# Patient Record
Sex: Female | Born: 1975 | Race: White | Hispanic: No | Marital: Married | Smoking: Never smoker
Health system: Southern US, Community
[De-identification: ages and names within clinical notes are randomized; demographics above are authoritative.]

## PROBLEM LIST (undated history)

## (undated) DIAGNOSIS — Z789 Other specified health status: Secondary | ICD-10-CM

## (undated) HISTORY — PX: CHOLECYSTECTOMY: SHX55

## (undated) HISTORY — DX: Other specified health status: Z78.9

## (undated) HISTORY — PX: HIATAL HERNIA REPAIR: SHX195

---

## 2008-04-20 ENCOUNTER — Ambulatory Visit (HOSPITAL_COMMUNITY): Admission: RE | Admit: 2008-04-20 | Discharge: 2008-04-20 | Payer: Self-pay | Admitting: Surgery

## 2008-04-27 ENCOUNTER — Ambulatory Visit (HOSPITAL_COMMUNITY): Admission: RE | Admit: 2008-04-27 | Discharge: 2008-04-27 | Payer: Self-pay | Admitting: Surgery

## 2008-05-03 ENCOUNTER — Ambulatory Visit (HOSPITAL_BASED_OUTPATIENT_CLINIC_OR_DEPARTMENT_OTHER): Admission: RE | Admit: 2008-05-03 | Discharge: 2008-05-03 | Payer: Self-pay | Admitting: Surgery

## 2008-05-07 ENCOUNTER — Ambulatory Visit: Payer: Self-pay | Admitting: Internal Medicine

## 2008-06-13 ENCOUNTER — Encounter: Admission: RE | Admit: 2008-06-13 | Discharge: 2008-09-11 | Payer: Self-pay | Admitting: Surgery

## 2008-08-07 ENCOUNTER — Ambulatory Visit (HOSPITAL_COMMUNITY): Admission: RE | Admit: 2008-08-07 | Discharge: 2008-08-08 | Payer: Self-pay | Admitting: Surgery

## 2008-08-07 ENCOUNTER — Encounter (INDEPENDENT_AMBULATORY_CARE_PROVIDER_SITE_OTHER): Payer: Self-pay | Admitting: Surgery

## 2008-08-07 HISTORY — PX: LAPAROSCOPIC GASTRIC BANDING: SHX1100

## 2008-09-28 ENCOUNTER — Encounter: Admission: RE | Admit: 2008-09-28 | Discharge: 2008-12-27 | Payer: Self-pay | Admitting: Surgery

## 2009-02-07 ENCOUNTER — Encounter: Admission: RE | Admit: 2009-02-07 | Discharge: 2009-03-28 | Payer: Self-pay | Admitting: Surgery

## 2010-07-09 LAB — COMPREHENSIVE METABOLIC PANEL
AST: 33 U/L (ref 0–37)
Albumin: 3.6 g/dL (ref 3.5–5.2)
Albumin: 4.1 g/dL (ref 3.5–5.2)
Alkaline Phosphatase: 63 U/L (ref 39–117)
BUN: 17 mg/dL (ref 6–23)
BUN: 6 mg/dL (ref 6–23)
CO2: 26 mEq/L (ref 19–32)
Calcium: 9.6 mg/dL (ref 8.4–10.5)
Chloride: 104 mEq/L (ref 96–112)
Chloride: 108 mEq/L (ref 96–112)
Creatinine, Ser: 0.76 mg/dL (ref 0.4–1.2)
GFR calc Af Amer: >60 mL/min (ref >60)
GFR calc non Af Amer: >60 mL/min (ref >60)
Potassium: 3.9 mEq/L (ref 3.5–5.1)
Total Bilirubin: 0.6 mg/dL (ref 0.3–1.2)
Total Protein: 7.2 g/dL (ref 6.0–8.3)

## 2010-07-09 LAB — DIFFERENTIAL
Basophils Absolute: 0 10*3/uL (ref 0.0–0.1)
Basophils Absolute: 0.1 10*3/uL (ref 0.0–0.1)
Basophils Relative: 1 % (ref 0–1)
Eosinophils Relative: 0 % (ref 0–5)
Eosinophils Relative: 2 % (ref 0–5)
Lymphocytes Relative: 30 % (ref 12–46)
Lymphs Abs: 1.9 10*3/uL (ref 0.7–4.0)
Monocytes Absolute: 0.4 10*3/uL (ref 0.1–1.0)
Monocytes Absolute: 0.6 10*3/uL (ref 0.1–1.0)
Monocytes Relative: 6 % (ref 3–12)
Neutro Abs: 3.9 10*3/uL (ref 1.7–7.7)
Neutro Abs: 7.8 10*3/uL — ABNORMAL HIGH (ref 1.7–7.7)

## 2010-07-09 LAB — CBC
HCT: 37.3 % (ref 36.0–46.0)
HCT: 40.4 % (ref 36.0–46.0)
Hemoglobin: 13 g/dL (ref 12.0–15.0)
MCV: 84.8 fL (ref 78.0–100.0)
MCV: 85.6 fL (ref 78.0–100.0)
Platelets: 262 10*3/uL (ref 150–400)
Platelets: 268 10*3/uL (ref 150–400)
RBC: 4.36 MIL/uL (ref 3.87–5.11)
RDW: 13.1 % (ref 11.5–15.5)
WBC: 6.2 10*3/uL (ref 4.0–10.5)
WBC: 9.7 10*3/uL (ref 4.0–10.5)

## 2010-07-09 LAB — HEMOGLOBIN AND HEMATOCRIT, BLOOD: HCT: 40.3 % (ref 36.0–46.0)

## 2010-08-13 NOTE — Procedures (Signed)
Vanessa Soto, Vanessa Soto              ACCOUNT NO.:  [id number]   MEDICAL RECORD NO.:  [id number]          PATIENT TYPE:  OUT   LOCATION:  SLEEP CENTER                 FACILITY:  [facility]   PHYSICIAN:  Clinton D. Maple Hudson, MD, FCCP, FACPDATE OF BIRTH:  09/11/1977   DATE OF STUDY:  05/03/2008                            NOCTURNAL POLYSOMNOGRAM   REFERRING PHYSICIAN:  Molli Hazard B. Daphine Deutscher, MD   INDICATION FOR STUDY:  Hypersomnia with sleep apnea.   EPWORTH SLEEPINESS SCORE:  Epworth sleepiness score 18/24.  BMI 43.3.  Weight 293 pounds.  Height 69 inches.  Neck 14.5 inches.   HOME MEDICATION:  Charted and reviewed.   SLEEP ARCHITECTURE:  Total sleep time 397 minutes with sleep efficiency  95.2%.  Stage I was 2.3%.  Stage II 63.4%.  Stage III 16.1%.  REM 18.2%  of total sleep time.  Sleep latency 5.5 minutes.  REM latency 143  minutes.  Awake after sleep onset 14 minutes.  Arousal index 31.1  suggesting increased EEG arousal.  No bedtime medication was taken.   RESPIRATORY DATA:  Apnea-hypopnea index (AHI) 0.9 per hour.  A total of  6 events were scored including 4 central apneas and 2 hypopneas.  Events  were more common while supine.  REM AHI 3.3 per hour.  There were  insufficient events to permit CPAP titration by split protocol.   OXYGEN DATA:  Moderate snoring with oxygen desaturation to a nadir of  92%.  Mean oxygen saturation through the study was 95.7% on room air.   CARDIAC DATA:  Normal sinus rhythm.   MOVEMENT-PARASOMNIA:  No significant movement disturbance.  No bathroom  trips.   IMPRESSION-RECOMMENDATION:  1. Unremarkable sleep architecture for sleep center environment.  2. Occasional respiratory event with sleep disturbance, within normal      limits, apnea-hypopnea index 0.9 per hour      (normal range 0-5 per hour).  Events were more common while supine.      Moderate snoring with oxygen desaturation to a nadir of 92%.      Clinton D. Maple Hudson, MD, [facility], FACP  Diplomate,  Biomedical engineer of Sleep Medicine  Electronically Signed     CDY/MEDQ  D:  05/06/2008 10:44:17  T:  05/07/2008 00:01:51  Job:  [id number]

## 2010-08-13 NOTE — Op Note (Signed)
Vanessa Soto, Vanessa Soto              ACCOUNT NO.:  [id number]   MEDICAL RECORD NO.:  [id number]          PATIENT TYPE:  OIB   LOCATION:                               FACILITY:  [facility]   PHYSICIAN:  Thornton Park. Daphine Deutscher, MD  DATE OF BIRTH:  March 01, 1976   DATE OF PROCEDURE:  DATE OF DISCHARGE:                               OPERATIVE REPORT   PREOPERATIVE DIAGNOSES:  1. Morbid obesity, body mass index of 43, with gallstones.  2. History of chronic cholecystitis.  3. Gastroesophageal reflux, with daily reflux if not on proton pump      inhibitor.   PROCEDURE:  Laparoscopic hiatal hernia repair (3 suture posterior  closure), Lap-Band APS system, laparoscopic cholecystectomy with  intraoperative cholangiogram, subcutaneous placement of Lap-Band port.   SURGEON:  Thornton Park. Daphine Deutscher, M.D.   ASSISTANT:  Sandria Bales. Ezzard Standing, M.D.   ANESTHESIA:  General endotracheal.   DESCRIPTION OF PROCEDURE:  This 35 year old white female was taken to  room-1 on Monday, Aug 07, 2008, given general anesthesia.  The abdomen  was prepped with Techni-Care equivalent and draped sterilely.  She has  claimed latex sensitivity so latex free products were used.  The abdomen  was entered through the left upper quadrant using the Optiview 0-degree  10/11 without difficulty, and then abdomen was insufflated.  Standard  ports were initially placed including a 15 in the right upper quadrant,  which we eventually had to move those around to accommodate the lap  chole.  First, we put Nathanson retractor up and retracted the liver,  and then she had an obvious dimple present.  I did a posterior  dissection and found both the right and left crura and I closed those  with 3 simple interrupteds with the Endo Stitch using tie knots.  We had  placed the calibration balloon down with 10 mL in the balloon, pulled it  back, and went up in the chest.  After the closure, it stayed where it  is supposed to, giving [place] indication that this  was an adequate closure.   The band passer then passed around and the APS was introduced into the  abdomen and threaded through and pulled around and engaged in the  buckle.  The buckle snapped over the tubing, which was then removed.  The band was then plicated with 3 sutures, free stitches, and using the  tie knots.   Next, we turned our attention to the gallbladder.  We had to place some  extra 5 mm laterally on the right.  We still had some difficulty just  with visualization because of the awkwardness of the port placements.  We were able to use the angle scope, dissect out a lot of adhesions,  taking those down.  She had a lot of adhesions to her duodenum, and then  we dissected free Calot triangle, got a critical view, put a clip upon  the gallbladder, and I incised the cystic duct.  I brought in a Reddick  catheter with the introducing tubing and then put it into what was the  common duct, blowing it up, and  taking a cholangiogram which showed  proximal intrahepatic filling, and then as I lowered the balloon I got  distal filling.  The cystic duct remnant was then triple clipped and  divided, and then the gallbladder was removed from the gallbladder bed  without entering it or spilling it.  Once detached, it was placed in a  bag and brought out through the 15-mm trocar port.  The tubing was  brought out through the lower port there on the right and affixed to a  band port which had mesh on the back, sewn in place with 4 sutures of  Prolene.  It was then tucked  in the subcutaneous pocket which was irrigated and closed with 4-0  Vicryl, Benzoin and Steri-Strips.  All the wounds were closed with 4-0  Vicryl, Benzoin and Steri-Strips.  The patient tolerated the procedure  well was taken to the recovery room in satisfactory condition.      Thornton Park Daphine Deutscher, MD  Electronically Signed     MBM/MEDQ  D:  08/07/2008  T:  08/07/2008  Job:  [id number]   cc:   Cala Bradford L. Matchett,  M.D.   Jethro Bastos  Fax: [phone]

## 2011-01-29 ENCOUNTER — Telehealth (INDEPENDENT_AMBULATORY_CARE_PROVIDER_SITE_OTHER): Payer: Self-pay | Admitting: General Surgery

## 2011-01-29 NOTE — Telephone Encounter (Signed)
The patient called to inform Vanessa Soto she is [redacted] weeks pregnant and was curious to know what she will need to do with the fluid in her lap band, She is asymptomatic for n/v or restriction. Patient has not been to the office for a fill since 10/14/11at which time her fluid was noted as 2.75cc. Expressed to the patient I would check with Dr Daphine Deutscher and contact her with advise.

## 2011-02-04 ENCOUNTER — Telehealth (INDEPENDENT_AMBULATORY_CARE_PROVIDER_SITE_OTHER): Payer: Self-pay | Admitting: Surgery

## 2011-02-04 ENCOUNTER — Telehealth (INDEPENDENT_AMBULATORY_CARE_PROVIDER_SITE_OTHER): Payer: Self-pay

## 2011-02-04 NOTE — Telephone Encounter (Signed)
02/04/11 mailed recall notice to patient for bariatric surgery follow-up. Adv pt to call [facility] to schedule an appt. cef °

## 2011-02-04 NOTE — Telephone Encounter (Signed)
Ms. Brownstein called and left a voicemail for French Ana in my voicemail, patient need's to speak with Dr. Norva Riffle nurse regarding her pregnancy and bariatric surgery.  Please call Ms. Nieto to discuss further.

## 2011-02-06 NOTE — Telephone Encounter (Signed)
Contacted the patient she states that she is doing fine, no problems with restriction, desirees to leave fluid in her band and it is fine with Dr Daphine Deutscher to leave the fluid in her band,

## 2011-03-05 ENCOUNTER — Encounter (INDEPENDENT_AMBULATORY_CARE_PROVIDER_SITE_OTHER): Payer: Self-pay | Admitting: Surgery

## 2011-03-05 ENCOUNTER — Ambulatory Visit (INDEPENDENT_AMBULATORY_CARE_PROVIDER_SITE_OTHER): Payer: BC Managed Care – PPO | Admitting: Surgery

## 2011-03-05 VITALS — BP 116/74 | HR 60 | Temp 97.4°F | Resp 16 | Ht 69.0 in | Wt 223.1 lb

## 2011-03-05 DIAGNOSIS — Z9884 Bariatric surgery status: Secondary | ICD-10-CM

## 2011-03-05 NOTE — Progress Notes (Signed)
Vanessa Soto came in today and she is pregnant and is due in May. She is one of the hiatal hernia study patient's hand she reports no GERD since her repair hiatal hernia. She is very pleased with that and in addition has lost about 60 pounds.  Today I accessed her port removed 1.5 cc because she's been very tight and has been regurgitating a lot over the last couple 3 weeks. It sounds like fluid retention and swelling is better been too tight. She is ill-appearing 5 and have her remove the fluid I told her if she has any problems between now and delivery come back and remove more. Otherwise I hope to see her in June after she said her baby will look at and included Bactroban.

## 2011-03-05 NOTE — Patient Instructions (Signed)
Returned after she delivered her baby and we can refill your band.  Come back and see me sooner if your band gets tighter as you retained fluid and pregnancy. Today are removed 1.5 cc from the band.

## 2016-03-11 ENCOUNTER — Encounter (HOSPITAL_COMMUNITY): Payer: Self-pay

## 2017-03-12 ENCOUNTER — Encounter (HOSPITAL_COMMUNITY): Payer: Self-pay

## 2018-03-18 ENCOUNTER — Encounter (HOSPITAL_COMMUNITY): Payer: Self-pay

## 2019-05-10 ENCOUNTER — Encounter: Payer: Self-pay | Admitting: Gastroenterology

## 2019-05-23 ENCOUNTER — Ambulatory Visit: Payer: BC Managed Care – PPO | Admitting: Gastroenterology

## 2019-05-31 ENCOUNTER — Ambulatory Visit: Payer: BC Managed Care – PPO | Admitting: Gastroenterology

## 2019-05-31 ENCOUNTER — Encounter: Payer: Self-pay | Admitting: Gastroenterology

## 2019-05-31 ENCOUNTER — Other Ambulatory Visit: Payer: Self-pay

## 2019-05-31 DIAGNOSIS — Z9884 Bariatric surgery status: Secondary | ICD-10-CM

## 2019-05-31 NOTE — Progress Notes (Signed)
Primary Care Physician:  Silas Flood, MD Primary Gastroenterologist:  Dr. Jena Gauss   Chief Complaint  Patient presents with  . Abdominal Pain    "comes goes"  . hx hiatal hernial surgery    food feels like it is getting stuck in chest, hx hiatal hernia surgery, lap band, gallbladder removal 07/2008. lap band currently does not have fluid in it    HPI:   Vanessa Soto is a 44 y.o. female presenting today at the request of Dr. Donzetta Matters due to abdominal pain and vomiting. She has a history of laparoscopic adjustable band in 2010, with hiatal hernia repair and cholecystectomy as well by Dr. Luretha Murphy. Appears from review of notes that 1.5 cc was removed from the band in 2012 while patient was pregnant. Son was born in 2013. From notes, appears fluid was 2.75 cc in 2011. She believes all fluid was removed from the band, but I am unable to verify this after review of notes. Possibility that approximately 1.25 cc remain.   Hasn't been to [facility] since 2012. Will sometimes regurgitate food. In the mornings, sips of water won't go down. Present for years. Can't eat for 3-4 hours after waking up or will come back up. May be able to eat a bite of cheese. If standing up, will do better. 2-3 bites of food at a time, then 10-15 minutes later will be able to eat more.  Will have days she can eat anything and then the rest of the time very small amounts. If eating bread will have pain. Eats more protein. Will have pain in LLQ at times.   No constipation or diarrhea.   Past Medical History:  Diagnosis Date  . Medical history non-contributory     Past Surgical History:  Procedure Laterality Date  . CHOLECYSTECTOMY    . HIATAL HERNIA REPAIR    . LAPAROSCOPIC GASTRIC BANDING  08/07/08    Current Outpatient Medications  Medication Sig Dispense Refill  . VITAMIN D PO Take by mouth as needed.     No current facility-administered medications for this visit.    Allergies as  of 05/31/2019  . (No Known Allergies)    Family History  Problem Relation Age of Onset  . Colon cancer Neg Hx   . Colon polyps Neg Hx     Social History   Socioeconomic History  . Marital status: Married    Spouse name: Not on file  . Number of children: Not on file  . Years of education: Not on file  . Highest education level: Not on file  Occupational History  . Not on file  Tobacco Use  . Smoking status: Never Smoker  . Smokeless tobacco: Never Used  Substance and Sexual Activity  . Alcohol use: No    Comment: rare  . Drug use: No  . Sexual activity: Not on file  Other Topics Concern  . Not on file  Social History Narrative  . Not on file   Social Determinants of Health   Financial Resource Strain:   . Difficulty of Paying Living Expenses: Not on file  Food Insecurity:   . Worried About Programme researcher, broadcasting/film/video in the Last Year: Not on file  . Ran Out of Food in the Last Year: Not on file  Transportation Needs:   . Lack of Transportation (Medical): Not on file  . Lack of Transportation (Non-Medical): Not on file  Physical Activity:   . Days of Exercise per Week:  Not on file  . Minutes of Exercise per Session: Not on file  Stress:   . Feeling of Stress : Not on file  Social Connections:   . Frequency of Communication with Friends and Family: Not on file  . Frequency of Social Gatherings with Friends and Family: Not on file  . Attends Religious Services: Not on file  . Active Member of Clubs or Organizations: Not on file  . Attends Archivist Meetings: Not on file  . Marital Status: Not on file  Intimate Partner Violence:   . Fear of Current or Ex-Partner: Not on file  . Emotionally Abused: Not on file  . Physically Abused: Not on file  . Sexually Abused: Not on file    Review of Systems: Gen: Denies any fever, chills, fatigue, weight loss, lack of appetite.  CV: Denies chest pain, heart palpitations, peripheral edema, syncope.  Resp: Denies  shortness of breath at rest or with exertion. Denies wheezing or cough.  GI: see HPI GU : Denies urinary burning, urinary frequency, urinary hesitancy MS: Denies joint pain, muscle weakness, cramps, or limitation of movement.  Derm: Denies rash, itching, dry skin Psych: Denies depression, anxiety, memory loss, and confusion Heme: Denies bruising, bleeding, and enlarged lymph nodes.  Physical Exam: BP 118/69   Pulse 61   Temp (!) 97.1 F (36.2 C) (Oral)   Ht 5\' 9"  (1.753 m)   Wt 215 lb 9.6 oz (97.8 kg)   LMP 05/15/2019   BMI 31.84 kg/m  General:   Alert and oriented. Pleasant and cooperative. Well-nourished and well-developed.  Head:  Normocephalic and atraumatic. Eyes:  Without icterus, sclera clear and conjunctiva pink.  Ears:  Normal auditory acuity. Lungs:  Clear to auscultation bilaterally. No wheezes, rales, or rhonchi. No distress.  Heart:  S1, S2 present without murmurs appreciated.  Abdomen:  +BS, soft, non-tender and non-distended. No HSM noted. No guarding or rebound. No masses appreciated. Lap band port palpable in RLQ  Rectal:  Deferred  Msk:  Symmetrical without gross deformities. Normal posture. Extremities:  Without edema. Neurologic:  Alert and  oriented x4 Psych:  Alert and cooperative. Normal mood and affect.  ASSESSMENT: [name] is a 44 y.o. female presenting today with history of lap band placement in 2010, reporting all fluid aspirated at time of her pregnancy in 2012; however, there is a possibility some fluid remains after review of notes dating back to that time. She is now reporting regurgitation of food chronically, inability to eat or drink when first waking, significantly increased early satiety beyond normal s/p banding; interestingly, she notes some days she is able to eat without any difficulty.   I am concerned about possible slippage. She may simply need additional fluid removed. No need for EGD at this time unless concern for band erosion,  which does not seem to fit the clinical picture. Will pursue UGI first. I discussed with her likely referral back to see Dr. Hassell Done, as her symptoms are likely related to post-bariatric procedure.    PLAN:  UGI in near future  Further recommendations to follow    Annitta Needs, PhD, ANP-BC [facility]

## 2019-05-31 NOTE — Patient Instructions (Signed)
We are arranging the upper GI xray to further assess positioning of your band.   Further recommendations to follow!  It was a pleasure to see you today. I want to create trusting relationships with patients to provide genuine, compassionate, and quality care. I value your feedback. If you receive a survey regarding your visit,  I greatly appreciate you taking time to fill this out.   Gelene Mink, PhD, ANP-BC [facility]

## 2019-06-07 ENCOUNTER — Ambulatory Visit (HOSPITAL_COMMUNITY)
Admission: RE | Admit: 2019-06-07 | Discharge: 2019-06-07 | Disposition: A | Discharge status: Home / Self Care | Payer: BC Managed Care – PPO | Source: Ambulatory Visit | Attending: Gastroenterology | Admitting: Gastroenterology

## 2019-06-07 ENCOUNTER — Other Ambulatory Visit: Payer: Self-pay | Admitting: Gastroenterology

## 2019-06-07 ENCOUNTER — Other Ambulatory Visit: Payer: Self-pay

## 2019-06-07 DIAGNOSIS — Z9884 Bariatric surgery status: Secondary | ICD-10-CM

## 2019-06-08 ENCOUNTER — Other Ambulatory Visit: Payer: Self-pay | Admitting: *Deleted

## 2019-06-08 DIAGNOSIS — Z9884 Bariatric surgery status: Secondary | ICD-10-CM

## 2019-06-08 MED ORDER — PANTOPRAZOLE SODIUM 40 MG PO TBEC: 40.0000 mg | DELAYED_RELEASE_TABLET | Freq: Every day | ORAL | 3 refills | Status: AC

## 2019-06-08 NOTE — Addendum Note (Signed)
Addended by: Gelene Mink on: 06/08/2019 01:05 PM   Modules accepted: Orders

## 2021-08-08 IMAGING — RF DG UGI W/ HIGH DENSITY W/O KUB
13 of 17 series · 13 of 24 positions shown · non-contrast
Comparison: 08/08/2008

CLINICAL DATA: History of lap band, query slippage or erosion.
Sensation of foods and liquids stopping in the distal esophagus.

EXAM:
UPPER GI SERIES WITH KUB
TECHNIQUE: After obtaining a scout radiograph a routine upper GI series was
performed using thin and high density barium
FLUOROSCOPY TIME:  Fluoroscopy Time:  3 minutes, 54 seconds
Radiation Exposure Index (if provided by the fluoroscopic device):
54.0 mGy
Number of Acquired Spot Images: 5

[Series 1: fluoro_barium 2fps_bw · 0.17mm/px · 1 of 1 slices shown (1 of 2)]
[im 1/1]
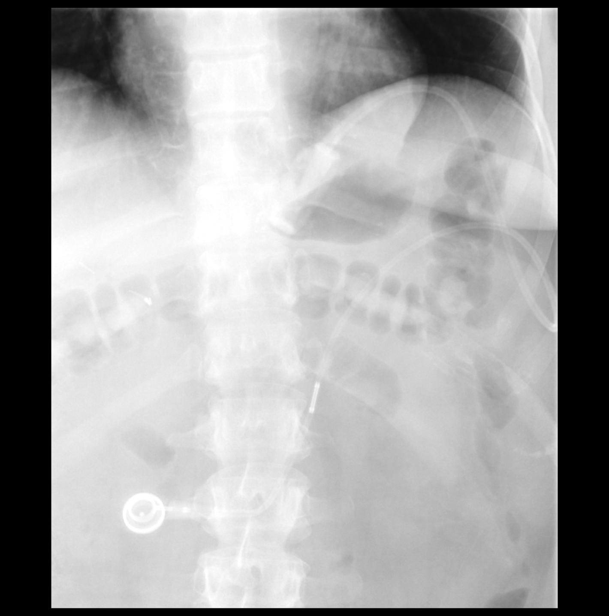

[Series 5: cp_standard · 0.18mm/px · 1 of 29 frames shown (1 of 11)]
[frame 15/29]
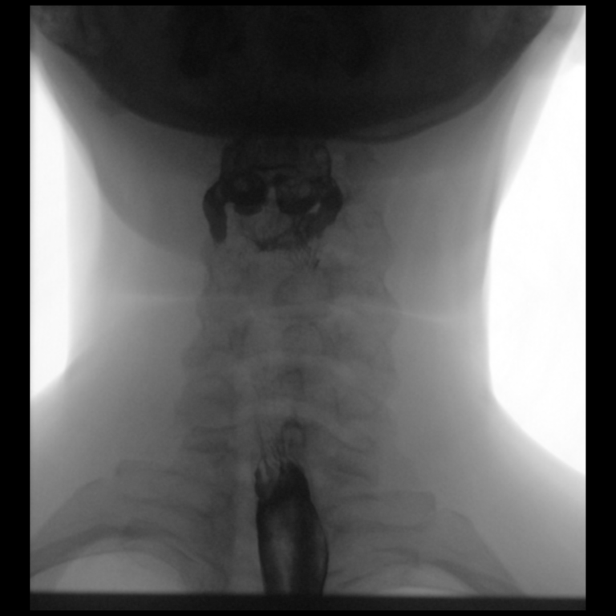

[Series 7: cp_standard · 0.17mm/px · 1 of 95 frames shown (2 of 11)]
[frame 15/95]
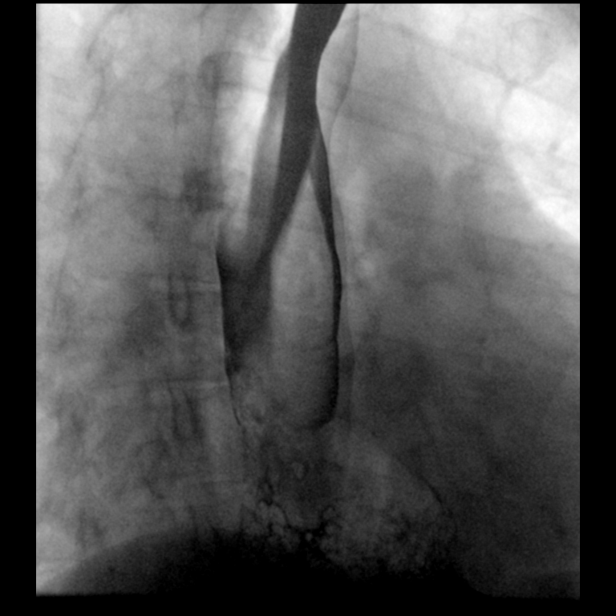

[Series 8: cp_standard · 0.18mm/px · 1 of 22 frames shown (3 of 11)]
[frame 12/22]
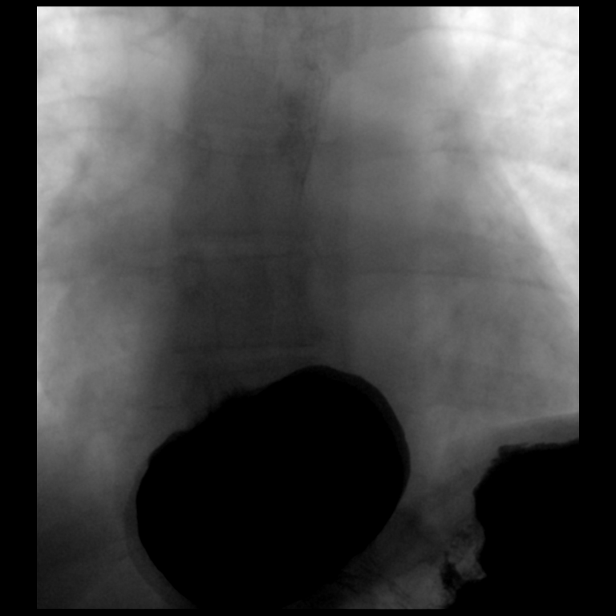

[Series 10: cp_standard · 0.17mm/px · 1 of 21 frames shown (4 of 11)]
[frame 18/21]
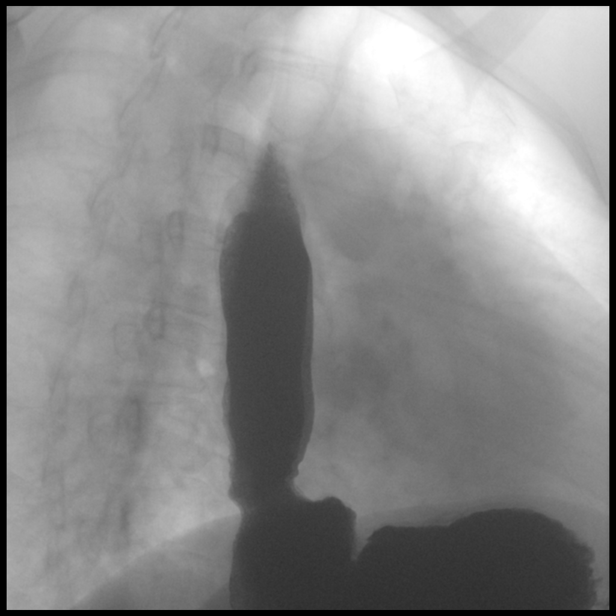

[Series 14: fluoro_barium 2fps_bw · 0.17mm/px · 1 of 1 slices shown (2 of 2)]
[im 1/1]
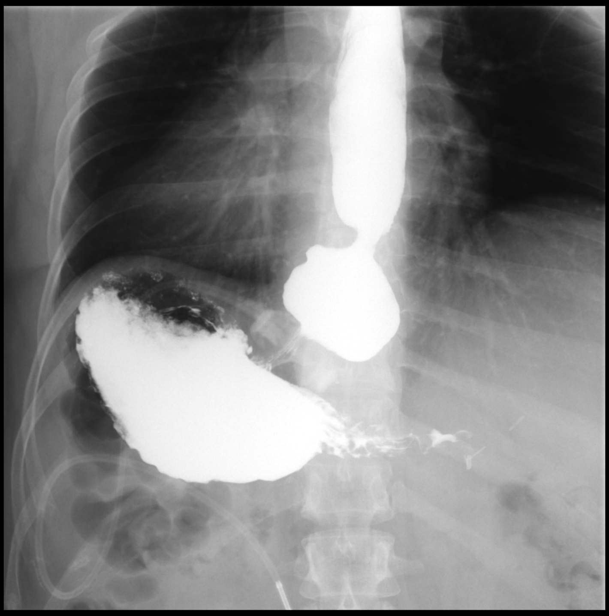

[Series 16: cp_standard · 0.17mm/px · 1 of 57 frames shown (5 of 11)]
[frame 49/57]
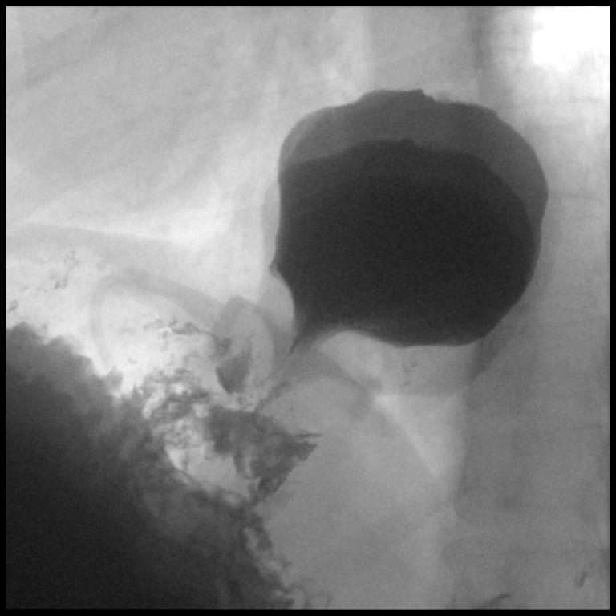

[Series 17: cp_standard · 0.17mm/px · 1 of 39 frames shown (6 of 11)]
[frame 22/39]
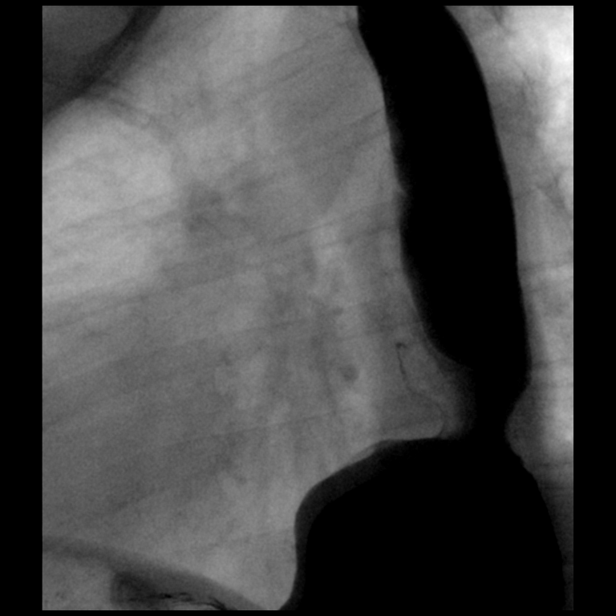

[Series 18: cp_standard · 0.18mm/px · 1 of 33 frames shown (7 of 11)]
[frame 29/33]
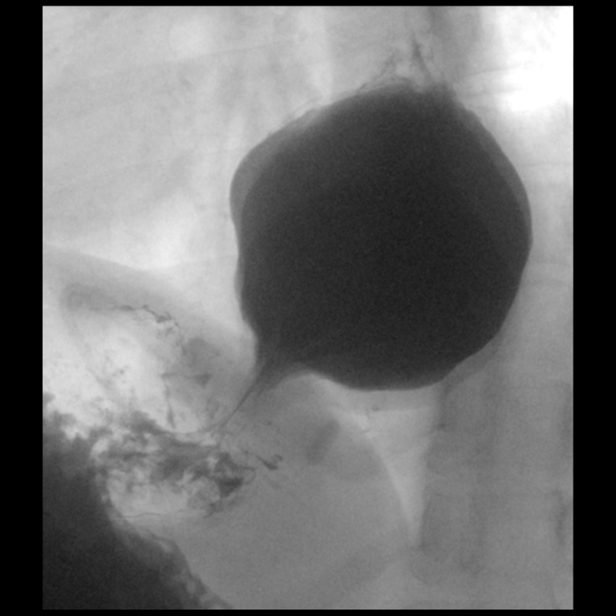

[Series 20: cp_standard · 0.17mm/px · 1 of 20 frames shown (8 of 11)]
[frame 18/20]
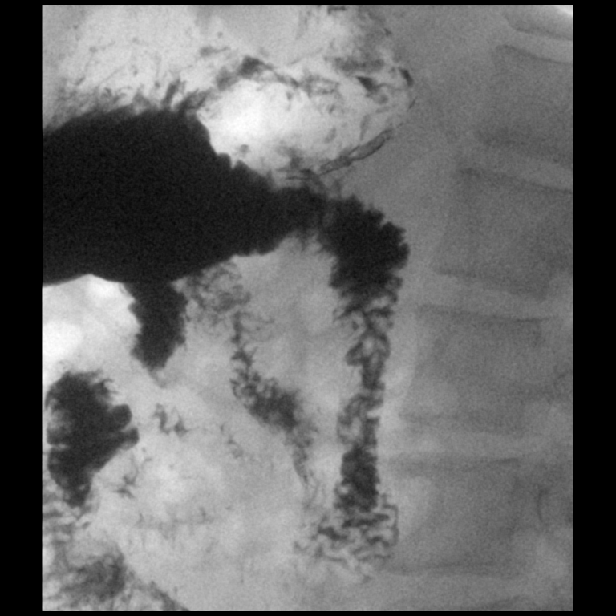

[Series 22: cp_standard · 0.17mm/px · 1 of 15 frames shown (9 of 11)]
[frame 1/15]
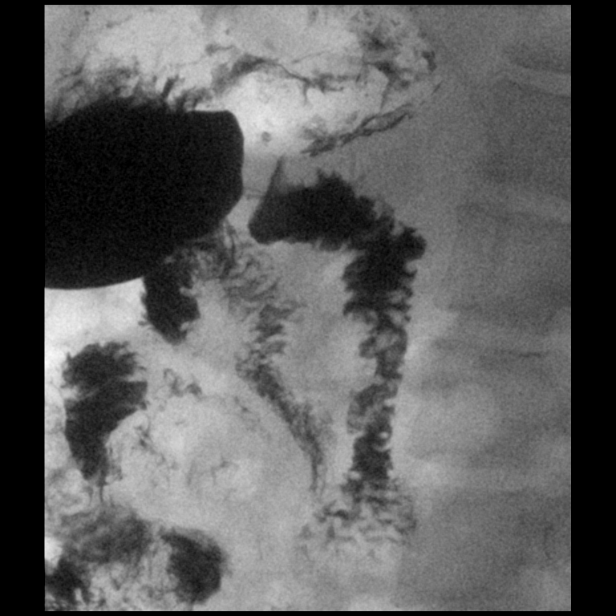

[Series 23: cp_standard · 0.17mm/px · 1 of 40 frames shown (10 of 11)]
[frame 35/40]
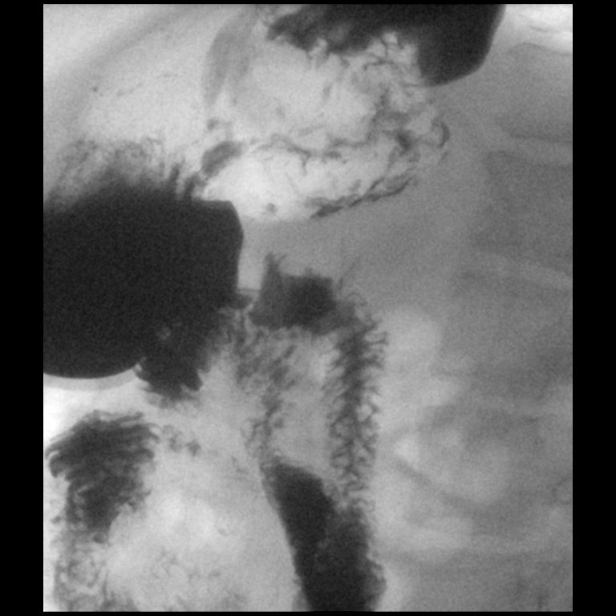

[Series 24: cp_standard · 0.17mm/px · 1 of 95 frames shown (11 of 11)]
[frame 81/95]
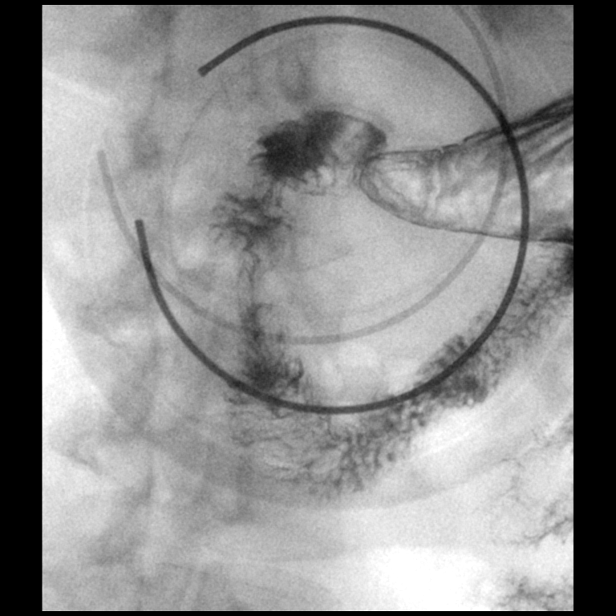

[13 of 24 positions shown; findings below may reference images not displayed]

FINDINGS: Spot images the abdomen for KUB demonstrate lap band in place just
below the hiatus with intact tubing and a 1 o'clock-7 o'clock
orientation. Cholecystectomy clips noted.

Pharyngeal phase of swallowing appears normal aside from flash
laryngeal penetration which was probably incidental. Primary
peristaltic waves in the esophagus were preserved on [DATE] swallows.
No esophageal erosions or ulcers are identified.

There is a moderate type 1 hiatal hernia which is slipped up into
the chest, above the level of the gastric band. There is frequent
stasis of contrast within this hiatal hernia, with numerous episodes
of reflux into the upper thoracic esophagus from this hernia during
the course of the examination. Narrowing of the stomach in the
vicinity of the lap band separating the hiatal hernia from the rest
of the stomach, as shown on series 9. I do not show obvious erosion
of the lap band into the gastric lumen.

There was suboptimal coating of the stomach by the barium despite
gravity maneuvers. No gas definite gastric lesion is identified.
Initial single contrast assessment of the duodenal bulb suggested
superior fold thickening and questionable ulcer, this was less
apparent on the air-contrast and balloon compression views and
accordingly I am skeptical of duodenal bulb ulcer.

The duodenum appears otherwise normal.
IMPRESSION: 1. Moderate hiatal hernia has slipped up into the chest above the
level of the gastric band. This is causing frequent reflux into the
esophagus, which triggered the patient's symptoms. I do not see
definite ulceration, stricture, or erosions along the distal
esophagus.
2. Questionable duodenal fold thickening along the upper margin of
the duodenal bulb, mild duodenitis not excluded. This was more
striking on the single contrast portion of the exam, but less
apparent with double contrast imaging.
3. Episode of flash laryngeal penetration of contrast during
swallowing, probably incidental.
4. The lap band is in place just below the hiatus with intact tubing
and a 1 o'clock orientation.
